# Patient Record
Sex: Male | Born: 1985 | Race: Black or African American | Hispanic: No | Marital: Single | Smoking: Never smoker
Health system: Southern US, Community
[De-identification: ages and names within clinical notes are randomized; demographics above are authoritative.]

## PROBLEM LIST (undated history)

## (undated) DIAGNOSIS — S72009A Fracture of unspecified part of neck of unspecified femur, initial encounter for closed fracture: Secondary | ICD-10-CM

## (undated) DIAGNOSIS — I10 Essential (primary) hypertension: Secondary | ICD-10-CM

## (undated) HISTORY — DX: Fracture of unspecified part of neck of unspecified femur, initial encounter for closed fracture: S72.009A

---
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
## 2004-08-22 ENCOUNTER — Emergency Department (HOSPITAL_COMMUNITY): Admission: EM | Admit: 2004-08-22 | Discharge: 2004-08-23 | Payer: Self-pay | Admitting: Emergency Medicine

## 2005-06-19 ENCOUNTER — Emergency Department (HOSPITAL_COMMUNITY): Admission: EM | Admit: 2005-06-19 | Discharge: 2005-06-20 | Payer: Self-pay | Admitting: Emergency Medicine

## 2007-12-20 ENCOUNTER — Emergency Department (HOSPITAL_COMMUNITY): Admission: EM | Admit: 2007-12-20 | Discharge: 2007-12-20 | Payer: Self-pay | Admitting: Emergency Medicine

## 2008-03-12 ENCOUNTER — Emergency Department (HOSPITAL_COMMUNITY): Admission: EM | Admit: 2008-03-12 | Discharge: 2008-03-12 | Payer: Self-pay | Admitting: Family Medicine

## 2009-09-05 ENCOUNTER — Emergency Department (HOSPITAL_COMMUNITY): Admission: EM | Admit: 2009-09-05 | Discharge: 2009-09-05 | Payer: Self-pay | Admitting: Emergency Medicine

## 2009-10-21 ENCOUNTER — Emergency Department (HOSPITAL_COMMUNITY): Admission: EM | Admit: 2009-10-21 | Discharge: 2009-10-21 | Payer: Self-pay | Admitting: Emergency Medicine

## 2011-03-03 ENCOUNTER — Inpatient Hospital Stay (INDEPENDENT_AMBULATORY_CARE_PROVIDER_SITE_OTHER)
Admission: RE | Admit: 2011-03-03 | Discharge: 2011-03-03 | Disposition: A | Discharge status: Home / Self Care | Payer: Self-pay | Source: Ambulatory Visit | Attending: Family Medicine | Admitting: Family Medicine

## 2011-03-03 DIAGNOSIS — R42 Dizziness and giddiness: Secondary | ICD-10-CM

## 2011-03-03 DIAGNOSIS — H6980 Other specified disorders of Eustachian tube, unspecified ear: Secondary | ICD-10-CM

## 2011-03-03 DIAGNOSIS — J309 Allergic rhinitis, unspecified: Secondary | ICD-10-CM

## 2011-06-25 IMAGING — CR DG CHEST 2V
2 series · 2 of 2 positions shown · non-contrast
Comparison: None.

CLINICAL DATA: Cough and shortness of breath.  Symptoms for the
past 3.5 weeks.

CHEST - 2 VIEW

[view not recorded (1 of 2)]
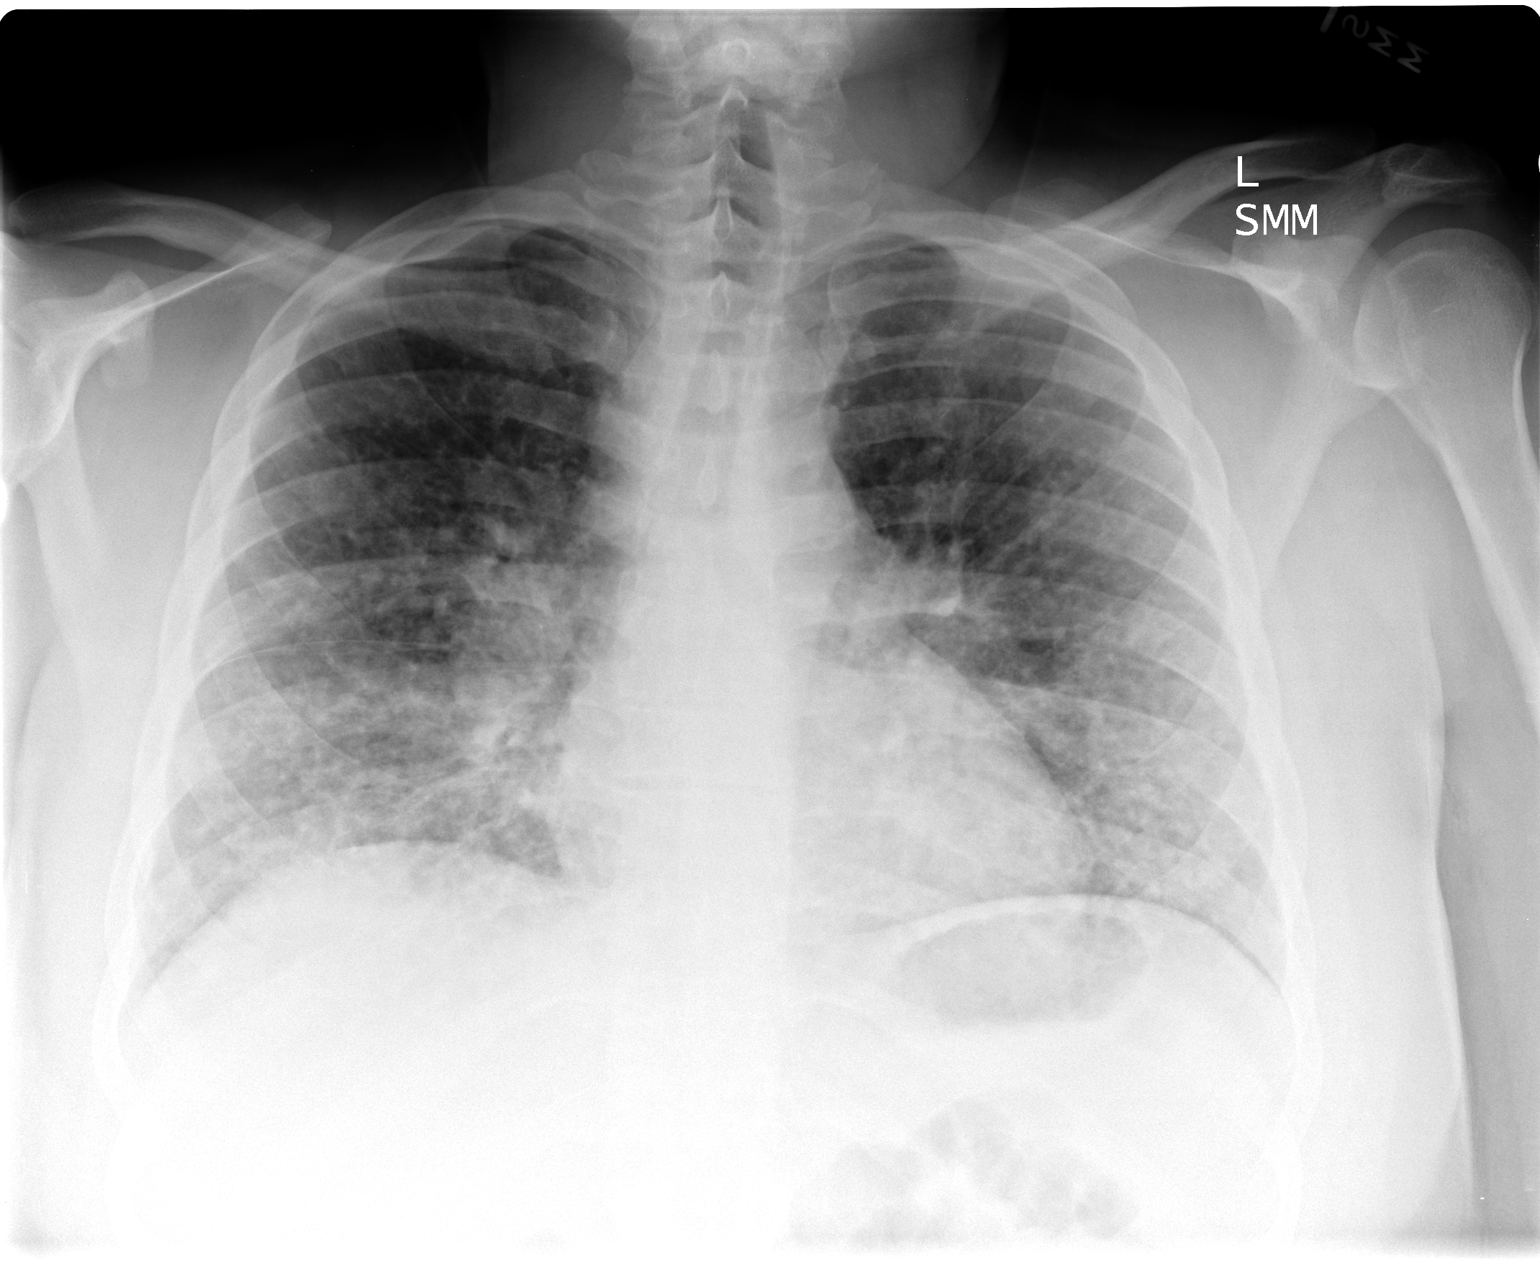

[view not recorded (2 of 2)]
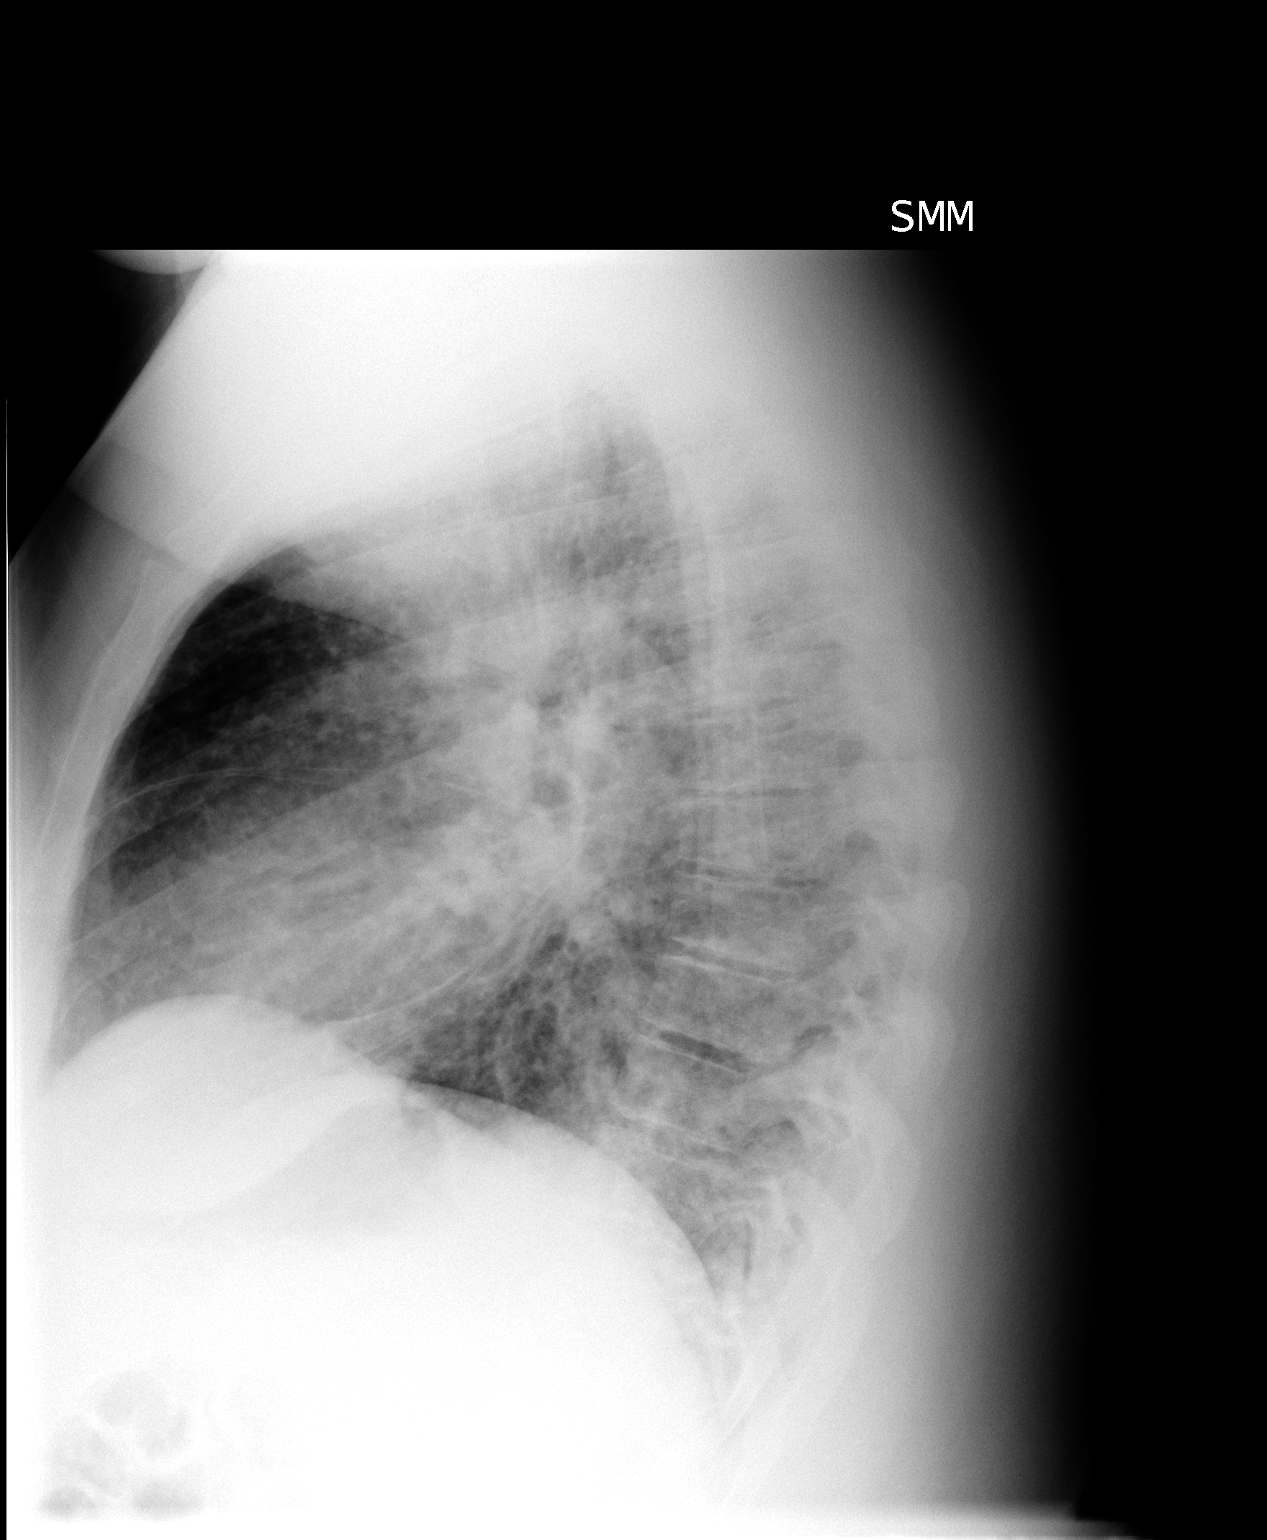

[2 of 2 positions shown; findings below may reference images not displayed]

FINDINGS: Normal sized heart.  Diffuse reticulonodular pattern
throughout both lungs.  Mild central peribronchial thickening.  No
airspace consolidation.  Unremarkable bones.
IMPRESSION: 1.  Diffuse reticulonodular pattern throughout both lungs.
Differential considerations include fungal infection, tuberculosis
infection and atypical mycobacterium infection.
2.  Mild bronchitic changes.

## 2011-06-29 ENCOUNTER — Encounter: Payer: Self-pay | Admitting: Sports Medicine

## 2011-06-29 ENCOUNTER — Ambulatory Visit (INDEPENDENT_AMBULATORY_CARE_PROVIDER_SITE_OTHER): Payer: BC Managed Care – PPO | Admitting: Sports Medicine

## 2011-06-29 VITALS — BP 215/113 | HR 101 | Temp 97.5°F | Ht 71.0 in | Wt 394.0 lb

## 2011-06-29 DIAGNOSIS — I10 Essential (primary) hypertension: Secondary | ICD-10-CM | POA: Insufficient documentation

## 2011-06-29 DIAGNOSIS — E669 Obesity, unspecified: Secondary | ICD-10-CM

## 2011-06-29 DIAGNOSIS — R05 Cough: Secondary | ICD-10-CM

## 2011-06-29 MED ORDER — OMEPRAZOLE 40 MG PO CPDR: 40.0000 mg | DELAYED_RELEASE_CAPSULE | Freq: Every day | ORAL | Status: DC

## 2011-06-29 NOTE — Assessment & Plan Note (Signed)
Reports a chronic cough of approximately 2 years. Has intermittent worsening specially whenever he has a viral-like syndrome. Reports this as "bronchitis" for which he has been given an albuterol inhaler for that does not help. In talking with him and doesn't like he potentially may have some reflux-like symptoms especially after eating greasy foods. I will trial a PPI to see if this helps with both his reflux symptoms as well as his cough as this is most likely a GERD associated cough.  He has had abnormal chest x-rays in the past that showed bronchitic changes as well as reticulonodular pattern which is had a negative PPD 4.  Consider repeat chest x-ray at next visit if cough does not improve with PPI and discontinuation PPI at that time.

## 2011-06-29 NOTE — Patient Instructions (Addendum)
It was nice meeting you today. There is lots that we talked about today but the big picture is that weight loss is the goal that we share for you.  I'm encouraged by the fact that she did so well with the biggest loser couple years ago and feel that she will have even better success with our plan. 3 things we talked about today that you have say you're be able to do is to: 1. Completed food diary for 3 random days in the next month. I like for one of these days to be over the weekend and 2 during the week. Please write down every single thing that she put in her mouth and include the quantity of how much you eat. 2. You told me you have been drinking a couple bottles of juices each day but has cut out sodas. Juices can be good sources of vitamins however they are full of sugar and loss of calories.  Thank cutting back on the beverages other than water will be a great goal for the next month. 3. You stated that as who has been your downfall. You also mentioned that substituting salads for when you go to fast food restaurants as he feels he be able to do to make steps in the right direction. This is a great plan and encourage you to do so and remember to even cut back on portion sizes of the salad dressing as this can be full of extra fat.  Do want to see you back in one month, at that time I would like to check labs on you.  I have also sent in a prescription to [facility] on [address] for omeprazole to potentially help out with your cough and reflux-like symptoms.  If anything worsens or changes please feel free call our office and schedule an earlier appointment, we do have an emergency 24-hour line for emergent questions you may have.  I'll see you in one month.

## 2011-06-29 NOTE — Assessment & Plan Note (Signed)
Patient had initially greatly elevated blood pressure on automated cuff. On recheck was 148/92.  Still within diagnostic range of hypertension however will need repeat a pressure monitoring for confirmation of diagnosis.  Discussed obesity related blood pressure changes and her current focus within his plan for blood pressure reduction is weight loss.  Please see obesity section for further plans.

## 2011-06-29 NOTE — Progress Notes (Signed)
Patient ID: Shawn Ryan, male   DOB: Dec 24, 1985, 26 y.o.   MRN: [id number] Subjective:  Shawn Ryan is a 26 y.o. male presenting today to establish care. He is not had a PCP and his lipids in urgent care for recurrent bronchitis and was noted to have high blood pressure. His parents are both patients of [facility].  His primary concern today is his weight as well as his blood pressure.  He is never been this heavy before and is weighing in at close to 400 pounds. He did participate in a biggest loser competition approximately 2 years ago was able to lose 65 pounds and was at 300 pounds at the end of the competition. He subsequently regained approximately 100 pounds over the past 2 years and attributes much of this to return fast food eating.  His other concern today is his "chronic bronchitis". He has had issues over the past 2 years with a cough that is exacerbated whenever he has cold-like symptoms. He's been trialed on a albuterol inhaler that has been ineffective in relieving his symptoms. He has no wheezing he has no respiratory distress.    ROS: Constitutional  weight loss as above, reports being tired when he gets home at night,   Infectious  reports recurrent bronchitis over the past couple years, no other infectious diseases, does work in a school with many sick contacts   Resp  Cough, nonproductive persistent however intermittently worsened over the past 2 years. Currently at baseline without exacerbation.   Does report history of snoring, no known sleep apnea,   History of reticulonodular pattern on chest x-ray, negative PPD   Cardiac  no chest pain, no palpitations   GI  mild reflux-like symptoms worsened with greasy foods, persistently  present however   GU  deferred   Skin  deferred   MSK  no reported joint issues   Trauma  questional traumatic hip fracture as 26 year old, collision during basketball game resulted in soreness of hip that was found to have for unknown type fracture    Nutrition  each fast food twice daily, as previously enjoyed salads to lose weight, previous soda drinker as well as juice drinker, does report enjoying plain water   Activity  relatively inactive on daily basis, 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 Does report that the treadmill is tolerable and a good source of exercise for him.   Neuro  deferred   Psych  has had 3 separate people and separate occasions report and they're concerned he may be bipolar due to a short temper.    ROS as per HPI and above otherwise 12 point ROS negative.   PE: GENERAL:  Adult obese African American male examined in [facility]. Alert and properly interactive, in no acute distress, no respiratory distress HNEENT: Atraumatic, normocephalic, no scleral icterus, moist mucous membranes THORAX: HEART: Regular rate and rhythm, S1-S2 heard, no murmur LUNGS: Clear to auscultation bilaterally, no crackles, no wheezes ABDOMEN:  Morbidly obese, positive bowel sounds, soft nontender, no rigidity, no guarding EXTREMITIES: Moves all 4 extremities spontaneously, no focal tenderness, no lateralization, no peripheral edema, 2+ out of 4 dorsalis pedis and posterior tibials pulses. >PSYCH: has never had any issues with racing thoughts, money or law issues, dangerous behaviors, persistently elevated or depressed mood. He does report he has been less interested and has a low energy level however does not feel depressed at this time.

## 2011-06-29 NOTE — Assessment & Plan Note (Addendum)
Patient has had great success with the biggest loser in the past going from 365 pounds to 300 pounds approximate 2 years ago ever since that time he has regained 100 pounds and has gone back to his former behaviors including eating fast food twice daily, drinking sodas (he cut this out about one month ago), drinking juices, eating inappropriate portion sizes.  He does feel like he can make the appropriate changes that he needs to and realizes that this needs to become a priority in his life. He does request some assistance and guidance with this and we will be more than happy to refer him to nutrition counseling after my next visit with him.  He has agreed to complete a 3 day food assessment was we will go over his next visit.  He also plans to decrease the amount of juices that he is drinking per day as well as make better choices at fast food restaurants and try to decrease how often he is eating at fast food restaurants. He reports that salads were big part of his prior weight loss and that he will return to eating those on a more regular basis.  Next visit we do need to do baseline laboratories for him including BMET, lipids, thyroid, A1c. Also consider doing PhD 9 next visit although patient denies current over depression or bipolar.

## 2011-07-30 ENCOUNTER — Ambulatory Visit: Payer: BC Managed Care – PPO | Admitting: Sports Medicine

## 2011-08-09 ENCOUNTER — Ambulatory Visit: Payer: BC Managed Care – PPO | Admitting: Sports Medicine

## 2011-09-15 ENCOUNTER — Encounter: Payer: Self-pay | Admitting: Sports Medicine

## 2011-09-15 ENCOUNTER — Ambulatory Visit (INDEPENDENT_AMBULATORY_CARE_PROVIDER_SITE_OTHER): Payer: BC Managed Care – PPO | Admitting: Sports Medicine

## 2011-09-15 VITALS — BP 156/94 | HR 100 | Ht 71.0 in | Wt 383.1 lb

## 2011-09-15 DIAGNOSIS — M722 Plantar fascial fibromatosis: Secondary | ICD-10-CM

## 2011-09-15 DIAGNOSIS — I10 Essential (primary) hypertension: Secondary | ICD-10-CM

## 2011-09-15 DIAGNOSIS — E669 Obesity, unspecified: Secondary | ICD-10-CM

## 2011-09-15 MED ORDER — LISINOPRIL-HYDROCHLOROTHIAZIDE 10-12.5 MG PO TABS: 1.0000 | ORAL_TABLET | Freq: Every day | ORAL | Status: DC

## 2011-09-15 NOTE — Patient Instructions (Signed)
It was great to see you today.  You have a condition called plantar fasciitis.  It is a degeneration of the connective tissue on the bottom of your feet.  I have gotten you a prescription to take to a medical supply store for cushioned sports orthotics.  If you have any issues please call our office. Please see the additional information I copied for you.  I am starting you on a BP medication that has Hydrochlorothiazide as well as Lisinopril in it.  This is a once a day medication.  I will need to get labs today and in 1 week.  You can make a nursing appointment in 1 week to follow up.  Please call Dr. Gerilyn Pilgrim to schedule a meeting regarding your diet.  She can help you with planning your meals as well as making some healthier food choices.  I will plan on seeing you in 1 month to follow up your BP, weight as well as your foot pain

## 2011-09-15 NOTE — Progress Notes (Signed)
HPI:  Shawn Ryan is a 26 y.o. male presenting today for evaluation of bilateral heel pain.  He stated that he first began in his right foot has progressed to his left foot.  There are approximately equal in severity.  He notices they are worse first thing in the morning and after a long day on his feet.  In one half months in duration with gradual worsening however is stable at this time.  He is tried new shoes without any improvement.  Does state that he has been trying to lose weight.  Has been increasing and salad and takes.  Has not really increase his activity.  Decreased soda intake.  Approximately 10 pound weight loss since last visit.  Would be interested in meeting with the nutritionist    ROS  Constitutional No fatigue  Infectious No fevers, no chills  Resp No cough, no congestion, no reported sleep apnea signs  MSK B heel pain as above  Trauma None reproted  Activity Unchanged by foot pain, no Significant regarding weight loss   Diet  still eats fast food most days, has substituted some salads.      Past Medical Hx Reviewed: yes Medications Reviewed: yes Family History Reviewed: yes   PE: GENERAL:   adult AA male.  Examined in  [facility].   sitting in exam chair comfortably   In no discomfort; norespiratory distress.   PSYCH: Alert and appropriately interactive  HNEENT: AT/[place], MMM, no scleral icterus, EOMi THORAX: HEART: RRR, S1/S2 heard, no murmur LUNGS: CTA B, no wheezes, no crackles EXTREMITIES: Moves all 4 extremities spontaneously, warm well perfused, no edema, bilateral DP and PT pulses 2/4.   >MSK/Osteopathic:  bilateral TTP over medial insertion of plantar fascia.  Flattened longitudinal arch, no bunion.  No calcaneal varus

## 2011-09-21 ENCOUNTER — Encounter: Payer: Self-pay | Admitting: Sports Medicine

## 2011-09-21 DIAGNOSIS — M722 Plantar fascial fibromatosis: Secondary | ICD-10-CM | POA: Insufficient documentation

## 2011-09-21 NOTE — Assessment & Plan Note (Signed)
Persistently elevated blood pressures.  Other weight loss will be the ultimate treatment for this, we'll start Zestoretic today.  We'll need to followup with creatinine in one week.

## 2011-09-21 NOTE — Assessment & Plan Note (Signed)
Referral for nutrition services to help with nutrition weight loss

## 2011-09-21 NOTE — Assessment & Plan Note (Addendum)
Patient provided written prescription for a longitudinal arch support sports and soles.  Patient also instructed to ice and eccentric strengthening exercises for the calf demonstrated.  If patient unable to obtain sports insoles will refer to sports medicine for further evaluation.

## 2012-10-15 ENCOUNTER — Other Ambulatory Visit: Payer: Self-pay | Admitting: Sports Medicine

## 2012-10-16 NOTE — Telephone Encounter (Signed)
90 days filled.  Needs to be seen for further refills

## 2013-07-03 ENCOUNTER — Other Ambulatory Visit: Payer: Self-pay | Admitting: Sports Medicine

## 2014-04-03 ENCOUNTER — Other Ambulatory Visit: Payer: Self-pay | Admitting: *Deleted

## 2014-04-03 MED ORDER — LISINOPRIL-HYDROCHLOROTHIAZIDE 10-12.5 MG PO TABS: ORAL_TABLET | ORAL | Status: DC

## 2014-04-03 NOTE — Telephone Encounter (Signed)
Could you please call him to schedule an appointment for a check up with labs.

## 2014-04-03 NOTE — Telephone Encounter (Signed)
Tried calling pt's number a few times.  Number is not valid.  Clovis PuMartin, Tamika L, RN

## 2014-07-27 ENCOUNTER — Other Ambulatory Visit: Payer: Self-pay | Admitting: Family Medicine

## 2014-11-26 ENCOUNTER — Other Ambulatory Visit: Payer: Self-pay | Admitting: *Deleted

## 2014-11-26 NOTE — Telephone Encounter (Signed)
Received a refill request from this patient who was last seen by Dr. Berline Choughigby in 2013. Please have him make an appointment.

## 2014-11-29 ENCOUNTER — Telehealth: Payer: Self-pay | Admitting: Family Medicine

## 2014-11-29 NOTE — Telephone Encounter (Signed)
Unable to LM, no VM Pt sent in request to est

## 2014-12-30 ENCOUNTER — Ambulatory Visit: Payer: BC Managed Care – PPO | Admitting: Family Medicine

## 2015-02-26 ENCOUNTER — Encounter: Payer: Self-pay | Admitting: Family Medicine

## 2015-02-26 ENCOUNTER — Ambulatory Visit (INDEPENDENT_AMBULATORY_CARE_PROVIDER_SITE_OTHER): Payer: 59 | Admitting: Family Medicine

## 2015-02-26 VITALS — BP 160/109 | HR 102 | Temp 98.6°F | Ht 71.0 in | Wt >= 6400 oz

## 2015-02-26 DIAGNOSIS — I10 Essential (primary) hypertension: Secondary | ICD-10-CM

## 2015-02-26 MED ORDER — LISINOPRIL-HYDROCHLOROTHIAZIDE 10-12.5 MG PO TABS: 1.0000 | ORAL_TABLET | Freq: Every day | ORAL | Status: DC

## 2015-02-26 MED ORDER — ALBUTEROL SULFATE (2.5 MG/3ML) 0.083% IN NEBU: 2.5000 mg | INHALATION_SOLUTION | Freq: Four times a day (QID) | RESPIRATORY_TRACT | Status: DC | PRN

## 2015-02-26 NOTE — Progress Notes (Signed)
   Subjective:    Shawn Ryan - 29 y.o. male MRN [id number]  Date of birth: 1986-03-05  HPI  Shawn Ryan is here for physical.   HTN Disease Monitoring: Home BP Monitoring none  Medications:lisinopril/HCTZ  Chest pain- none     Dyspnea- none Compliance-  Has been out for a few weeks now .  Lightheadedness-  no   Edema- no   Morbid obesity:  He hasn't tried any formal form of weight loss to this point. He does not follow a specific diet He used to walk but has not been as of late. He would like to lose weight and he knows that this is going to be a problem if he doesn't start trying. He is not interested in bariatric surgery at this point area He has a family history of diabetes and hypertension.   Health Maintenance:  Health Maintenance Due  Topic Date Due  . HIV Screening  12/16/1999  . INFLUENZA VACCINE  01/20/2015    -  reports that he has never smoked. He has never used smokeless tobacco. - Review of Systems: Per HPI. - Past Medical History: Patient Active Problem List   Diagnosis Date Noted  . Morbid obesity 06/29/2011  . Hypertension 06/29/2011   - Medications: reviewed and updated Current Outpatient Prescriptions  Medication Sig Dispense Refill  . albuterol (PROVENTIL) (2.5 MG/3ML) 0.083% nebulizer solution Take 3 mLs (2.5 mg total) by nebulization every 6 (six) hours as needed for wheezing or shortness of breath. 150 mL 1  . ALBUTEROL IN Inhale 2 puffs into the lungs as needed.      Marland Kitchen lisinopril-hydrochlorothiazide (PRINZIDE,ZESTORETIC) 10-12.5 MG per tablet Take 1 tablet by mouth daily. 90 tablet 0  . omeprazole (PRILOSEC) 40 MG capsule Take 1 capsule (40 mg total) by mouth daily. 30 capsule 3   No current facility-administered medications for this visit.     Review of Systems See HPI     Objective:   Physical Exam BP 160/109 mmHg  Pulse 102  Temp(Src) 98.6 F (37 C) (Oral)  Ht  (1.803 m)  Wt 451 lb (204.572 kg)  BMI 62.93  kg/m2 Gen: NAD, alert, cooperative with exam, morbidly obese, well-appearing CV: RRR, good S1/S2, no murmur,  Resp: CTABL, no wheezes, non-labored Skin: no rashes, normal turgor  Neuro: no gross deficits.  Psych: good insight, alert and oriented    Assessment & Plan:   Hypertension Uncontrolled but has been out of medications. - Refilled medicines - Advised a nurse visit for blood pressure check a week after starting the medications. - Follow-up in 4 months. - Consider lab work next time.  Morbid obesity Patient understands that he needs to try to lose some weight. He has not tried any specific formal weight loss previously. - Given instructions on exercise and diet - He'll try this for the next 4 months if is no improvement may need to consider nutrition referral - Discussed the evaluation of bariactric surgery at some point but he doesn't want this currently.

## 2015-02-26 NOTE — Patient Instructions (Addendum)
Thank you for coming in,   Follow up with me in 4 to 5 months.   We will check your weight loss at that point and see how you are doing.   Please try to walk at least 30 minutes for 5 days per week.     Please feel free to call with any questions or concerns at any time, at [phone]. --Dr. Jordan Likes  Diet Recommendations for High blood pressure  Starchy (carb) foods include: Bread, rice, pasta, potatoes, corn, crackers, bagels, muffins, all baked goods.   Protein foods include: Meat, fish, poultry, eggs, dairy foods, and beans such as pinto and kidney beans (beans also provide carbohydrate).   1. Eat at least 3 meals and 1-2 snacks per day. Never go more than 4-5 hours while awake without eating.  2. Limit starchy foods to TWO per meal and ONE per snack. ONE portion of a starchy  food is equal to the following:   - ONE slice of bread (or its equivalent, such as half of a hamburger bun).   - 1/2 cup of a "scoopable" starchy food such as potatoes or rice.   - 1 OUNCE (28 grams) of starchy snack foods such as crackers or pretzels (look on label).   - 15 grams of carbohydrate as shown on food label.  3. Both lunch and dinner should include a protein food, a carb food, and vegetables.   - Obtain twice as many veg's as protein or carbohydrate foods for both lunch and dinner.   - Try to keep frozen veg's on hand for a quick vegetable serving.     - Fresh or frozen veg's are best.  4. Breakfast should always include protein.

## 2015-02-27 ENCOUNTER — Telehealth: Payer: Self-pay | Admitting: Family Medicine

## 2015-02-27 NOTE — Assessment & Plan Note (Signed)
Patient understands that he needs to try to lose some weight. He has not tried any specific formal weight loss previously. - Given instructions on exercise and diet - He'll try this for the next 4 months if is no improvement may need to consider nutrition referral - Discussed the evaluation of bariactric surgery at some point but he doesn't want this currently.

## 2015-02-27 NOTE — Assessment & Plan Note (Signed)
Uncontrolled but has been out of medications. - Refilled medicines - Advised a nurse visit for blood pressure check a week after starting the medications. - Follow-up in 4 months. - Consider lab work next time.

## 2015-02-27 NOTE — Telephone Encounter (Signed)
The Rx given yesterday for albuterol is wrong. He only has an inhaler not a nebulizer. He needs the inhaler

## 2015-02-28 MED ORDER — ALBUTEROL SULFATE HFA 108 (90 BASE) MCG/ACT IN AERS: 2.0000 | INHALATION_SPRAY | Freq: Four times a day (QID) | RESPIRATORY_TRACT | Status: AC | PRN

## 2015-02-28 NOTE — Telephone Encounter (Signed)
Seen by Dr. Jordan Likes. Will order albuterol MDI.

## 2015-02-28 NOTE — Telephone Encounter (Signed)
Pt is calling to check the status of the medication mix up. He really needs this fixed. jw

## 2015-04-14 ENCOUNTER — Ambulatory Visit (INDEPENDENT_AMBULATORY_CARE_PROVIDER_SITE_OTHER): Payer: 59 | Admitting: Family Medicine

## 2015-04-14 ENCOUNTER — Encounter: Payer: Self-pay | Admitting: Family Medicine

## 2015-04-14 DIAGNOSIS — K219 Gastro-esophageal reflux disease without esophagitis: Secondary | ICD-10-CM | POA: Diagnosis not present

## 2015-04-14 DIAGNOSIS — I10 Essential (primary) hypertension: Secondary | ICD-10-CM

## 2015-04-14 DIAGNOSIS — R05 Cough: Secondary | ICD-10-CM | POA: Insufficient documentation

## 2015-04-14 DIAGNOSIS — R053 Chronic cough: Secondary | ICD-10-CM

## 2015-04-14 LAB — BASIC METABOLIC PANEL WITH GFR
BUN: 13 mg/dL (ref 7–25)
CO2: 30 mmol/L (ref 20–31)
Calcium: 9.7 mg/dL (ref 8.6–10.3)
Chloride: 104 mmol/L (ref 98–110)
Creat: 0.97 mg/dL (ref 0.60–1.35)
GFR, Est African American: >89 mL/min (ref >=60)
GLUCOSE: 106 mg/dL — AB (ref 65–99)
POTASSIUM: 4.7 mmol/L (ref 3.5–5.3)
Sodium: 141 mmol/L (ref 135–146)

## 2015-04-14 LAB — CBC
HCT: 42.2 % (ref 39.0–52.0)
HEMOGLOBIN: 14.3 g/dL (ref 13.0–17.0)
MCH: 29.1 pg (ref 26.0–34.0)
MCHC: 33.9 g/dL (ref 30.0–36.0)
MCV: 85.8 fL (ref 78.0–100.0)
MPV: 11.3 fL (ref 8.6–12.4)
Platelets: 392 10*3/uL (ref 150–400)
RBC: 4.92 MIL/uL (ref 4.22–5.81)
RDW: 13.1 % (ref 11.5–15.5)
WBC: 7.8 10*3/uL (ref 4.0–10.5)

## 2015-04-14 LAB — LIPID PANEL
CHOL/HDL RATIO: 4.9 ratio (ref <=5.0)
Cholesterol: 182 mg/dL (ref 125–200)
HDL: 37 mg/dL — AB (ref >=40)
LDL CALC: 131 mg/dL — AB (ref <130)
Triglycerides: 70 mg/dL (ref <150)
VLDL: 14 mg/dL (ref <30)

## 2015-04-14 LAB — POCT GLYCOSYLATED HEMOGLOBIN (HGB A1C): Hemoglobin A1C: 6.2

## 2015-04-14 MED ORDER — PANTOPRAZOLE SODIUM 40 MG PO TBEC: 40.0000 mg | DELAYED_RELEASE_TABLET | Freq: Every day | ORAL | Status: AC

## 2015-04-14 MED ORDER — LISINOPRIL-HYDROCHLOROTHIAZIDE 20-25 MG PO TABS
1.0000 | ORAL_TABLET | Freq: Every day | ORAL | Status: DC
Start: 2015-04-14 — End: 2016-10-11

## 2015-04-14 NOTE — Assessment & Plan Note (Signed)
Discussed primacy of this condition with his current and future health problems including HTN and GERD. Medical and surgical options for weight loss were discussed, though he wishes to try to lose weight himself by eating regular meals with composition heavy in vegetables and low in carbohydrates/sugars in addition to daily walking.

## 2015-04-14 NOTE — Progress Notes (Signed)
Subjective: Shawn Ryan is a 29 y.o. male patient of mine who is new to me, [facility].  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 Cough is not better or worse at any time of day/night or in relation to food. He reports a diagnosis of bronchitis being made, but can't remember by whom or on what grounds other than a chest xray. His daily coughing started before starting lisinopril. He's tried no medications for the cough, but has an albuterol inhaler which he doesn't use. He denies wheezing, dyspnea, hemoptysis, chest pain, palpitations, orthopnea, leg swelling, N/V/D. Endorses occasional heartburn symptoms.   - Non-smoker - No FH pulmonary diseases  Objective: BP 166/98 mmHg  Pulse 97  Ht 5\' 10"  (1.778 m)  Wt 445 lb (201.851 kg)  BMI 63.85 kg/m2 Gen: Morbidly obese well-appearing 29 y.o. male in no distress HEENT: Normocephalic, sclerae clear, conjunctivae normal, pupils equal and reactive, nares normal, moist mucous membranes, posterior oropharynx clear, normal dentition CV: Regular rate, no murmur; radial, DP and PT pulses 2+ bilaterally; no LE edema, no JVD, cap refill < 2 sec. Pulm: Non-labored breathing ambient air; CTAB, no wheezes or crackles  Assessment/Plan: Shawn Ryan is a 29 y.o. male here for chronic cough.  Chronic cough Suspect this is not bronchitis in young nonsmoker without history of exacerbations or controller medications. Most likely causes are ACE inhibitor-induced (though he reports symptoms predating lisinopril) or GERD-related. Will try PPI in addition to weight loss. If not improved, will D/C ACE inhibitor next. Normal exam.   Morbid obesity Discussed primacy of this condition with his current and future health problems including HTN and GERD. Medical and surgical options for weight loss were discussed, though he wishes to try to lose weight himself by eating  regular meals with composition heavy in vegetables and low in carbohydrates/sugars in addition to daily walking.

## 2015-04-14 NOTE — Assessment & Plan Note (Signed)
Suspect this is not bronchitis in young nonsmoker without history of exacerbations or controller medications. Most likely causes are ACE inhibitor-induced (though he reports symptoms predating lisinopril) or GERD-related. Will try PPI in addition to weight loss. If not improved, will D/C ACE inhibitor next. Normal exam.

## 2015-04-15 ENCOUNTER — Telehealth: Payer: Self-pay | Admitting: Family Medicine

## 2015-04-15 NOTE — Telephone Encounter (Signed)
Please call patient with lab results from yesterday's visit.

## 2015-04-15 NOTE — Telephone Encounter (Signed)
Pt calling again, also wants to know if MD faxed off paperwork to his employer that he left yesterday

## 2015-04-16 NOTE — Telephone Encounter (Signed)
Pt calling once more about this request. Would like to know how long to expect the FMLA paperwork to be completed and faxed. Additionally, he wants a printout of his recent lab results. Thank you, Dorothey BasemanSadie Reynolds, ASA

## 2015-04-16 NOTE — Telephone Encounter (Signed)
Need pick up FMLA forms that were left to be faxed.  Employer only received part of documents.  Need all of the forms left.  Will pick up when ready.  Please inform patient when he can get them

## 2015-04-16 NOTE — Telephone Encounter (Signed)
Pt calling back, says his employer received part of the paperwork but not all, says there are some pages missing, please call pt back.

## 2015-04-17 NOTE — Telephone Encounter (Signed)
I faxed the forms in their entirety a while ago and I'm not sure where they could be found now. I will need another copy of the forms to fill out. I will do so and have him pick the forms up at the front office when completed so that he can present them to his employer. Can you please communicate this to him? Both of the phone numbers I called this time did not work.

## 2015-04-21 ENCOUNTER — Telehealth: Payer: Self-pay | Admitting: Family Medicine

## 2015-04-21 NOTE — Telephone Encounter (Signed)
antropazal  (sp?) is not doing good. Having shortness of breath  Please advise

## 2015-04-22 NOTE — Telephone Encounter (Signed)
I spoke with Shawn Ryan regarding a single episode of shortness of breath without wheezing that occurred at 5:00am last week upon waking, lasting a few minutes without chest pain. He went back to sleep and awoke without symptoms. He reports 100% compliance with medications. No orthopnea or leg swelling or other wheezing. His cough is no better. I told him that if he feels he is unable to breathe he should seek medical attention right away, but he will need to schedule an appointment to discuss our next steps which will likely be stopping lisinopril though I'd like to see his BP prior to doing this. He voiced understanding.

## 2015-04-25 NOTE — Telephone Encounter (Signed)
Attempted to call again.  Still not working. [name], Shawn Ryan

## 2016-01-01 ENCOUNTER — Telehealth: Payer: Self-pay | Admitting: *Deleted

## 2016-01-01 NOTE — Telephone Encounter (Signed)
Patient needs 2 things: 1) Patient needs a letter for his employer stating that he is allow multiple breaks during his work day due to the BP medication that he is on that makes him have to use the bathroom often. 2) He would like to also extend the dates as well as how often he is allotted FMLA.  Patient states that he would have to drop off a new form to be completed for this extension.  I informed him that he may need an appt to discuss this since he has a new PCP and the parameters of FMLA will need to be changed.    Will forward to MD to advise and if patient needs an appt we will call him back to schedule this. Shawn Ryan,CMA

## 2016-01-02 NOTE — Telephone Encounter (Signed)
Patient calling again regarding FMLA, needs an update from MD regarding whether this can be completed or if he will need an appointment

## 2016-01-05 NOTE — Telephone Encounter (Signed)
Please advise him to make a schedule to see me in office. I have never seen him before. Thanks!

## 2016-01-07 NOTE — Telephone Encounter (Signed)
Pt has an appt on 01/09/2016. Sunday SpillersSharon T Saunders, CMA

## 2016-01-08 ENCOUNTER — Encounter: Payer: Self-pay | Admitting: Student

## 2016-01-08 DIAGNOSIS — R7303 Prediabetes: Secondary | ICD-10-CM | POA: Insufficient documentation

## 2016-01-09 ENCOUNTER — Ambulatory Visit (INDEPENDENT_AMBULATORY_CARE_PROVIDER_SITE_OTHER): Payer: 59 | Admitting: Student

## 2016-01-09 ENCOUNTER — Encounter: Payer: Self-pay | Admitting: Student

## 2016-01-09 VITALS — BP 167/88 | HR 98 | Temp 98.0°F | Ht 70.0 in | Wt >= 6400 oz

## 2016-01-09 DIAGNOSIS — I10 Essential (primary) hypertension: Secondary | ICD-10-CM

## 2016-01-09 NOTE — Progress Notes (Signed)
Subjective:    Patient ID: Shawn Ryan, male    DOB: [birth year], 30 y.o.   MRN: [id number]  CC:   HPI #"Bronchitis": Reports having bronchitis. He says he had this for a long time. He reports intermittent cough. He had FMLA form filled back in October 2016 for this problem.  He reports having an episode of cough more frequently than indicated on FMLA paper (every 3 months) when it was filled back in October. He is asking for FMLA extension & change in frequency of his episodes. He denies coming to the clinic or going to ED for these episodes. When asked if he had lung function test he reports having chest x-rays but not the pulmonary function tests. Upon reviewing his chart I do not see any pulmonary function tests done.   #Urinary frequency: Reports having urinary frequency with his blood pressure medication (lisinopril-hydrochlorothiazide). He wants doctor's notes stating that he is on blood pressure medication that can cause increased urinary frequency so that he can have frequent breaks at work for restroom. Asked him if we can stop hydrochlorothiazide and increase the dose of lisinopril. He says he has been on that medication for long time and he doesn't want any change to his medication. I explained to him that we are not starting a new medication other than increasing the dose on his lisinopril and taking off the hydrochlorothiazide. He got upset and said "you are wasting my time and I'm done with you". He got up to leave. I said "I am sorry you felt that way and I would be happy to have my supervising Dr. talk to you". He agreed to that.  I discussed the situation with Dr. Lum Babe. When we walked into his room, patient was standing. Dr. Lum Babe greeted the patient and introduced herself. She explained to him about the need of pulmonary function test before we give him a diagnosis of bronchitis and fill his FMLA paper. He states "I don't care about FMLA paper anymore. I just need doctors notes  stating that I am on blood pressure medication that can cause frequent urination so that I can get frequent breaks at place of work". When Dr. Lum Babe discussed about taking off hydrochlorothiazide & increasing his lisinopril, he stated he doesn't want any changes to his medication. He will rather have another PCP. Dr. Lum Babe told him he can make that request when he checkout at the front desk.   After leaving patient's room, we have agreed to give him a letter saying he is on a blood pressure medication that have tendency to cause frequent urination without any recommendation. Accordingly, I gave him a letter with the following content. "Shawn Ryan is on blood pressure medication that has tendency to increased urinary frequency. I appreciate any accommodation given to him. Alternatives to this is to change his medication that has no urinary side effects."   Review of Systems  Per history of present illness  Objective:   Physical Exam Filed Vitals:   01/09/16 1448  BP: 167/88  Pulse: 98  Temp: 98 F (36.7 C)  TempSrc: Oral  Height:  (1.778 m)  Weight: 470 lb 12.8 oz (213.553 kg)    GUY:[id number] Obese, NAD I haven't done further exam PSYCH: Upset    Assessment & Plan:  Hypertension Mildly elevated. Unfortunately patient did not give me a chance to try this today. See history of present illness for more.   "Bronchitis": Self-reported. Once FMLA paper filled for this. Patient  has no formal workup such as pulmonary function test for this. We told him that this can be done in our clinic by our pharmacist if he is interested. He states he don't care about FMLA anymore. He just wants and notes about his blood pressure medication and urinary frequency so that he can have frequent breaks at work for bathroom.

## 2016-01-09 NOTE — Assessment & Plan Note (Signed)
Mildly elevated. Unfortunately patient did not give me a chance to try this today. See history of present illness for more.

## 2016-10-11 ENCOUNTER — Other Ambulatory Visit: Payer: Self-pay | Admitting: *Deleted

## 2016-10-11 DIAGNOSIS — I10 Essential (primary) hypertension: Secondary | ICD-10-CM

## 2016-10-11 MED ORDER — LISINOPRIL-HYDROCHLOROTHIAZIDE 20-25 MG PO TABS: 1.0000 | ORAL_TABLET | Freq: Every day | ORAL | 3 refills | Status: AC

## 2020-06-20 ENCOUNTER — Ambulatory Visit
Admission: EM | Admit: 2020-06-20 | Discharge: 2020-06-20 | Disposition: A | Discharge status: Home / Self Care | Payer: 59 | Attending: Emergency Medicine | Admitting: Emergency Medicine

## 2020-06-20 DIAGNOSIS — M545 Low back pain, unspecified: Secondary | ICD-10-CM | POA: Diagnosis not present

## 2020-06-20 MED ORDER — NAPROXEN 500 MG PO TABS: 500.0000 mg | ORAL_TABLET | Freq: Two times a day (BID) | ORAL | 0 refills | Status: DC

## 2020-06-20 MED ORDER — CYCLOBENZAPRINE HCL 5 MG PO TABS: 5.0000 mg | ORAL_TABLET | Freq: Two times a day (BID) | ORAL | 0 refills | Status: AC | PRN

## 2020-06-20 NOTE — ED Provider Notes (Signed)
[name]    CSN: [id number] Arrival date & time: 06/20/20  0935      History   Chief Complaint Chief Complaint  Patient presents with  . Knee Pain  . Back Pain    HPI Shawn Ryan is a 34 y.o. male  Presenting for right knee and right low back pain.  States knee pain is nearly resolved, though having right low back pain.  Describes as burning, sharp, tight.  Nonradiating.  No known injury, inciting event.  Denies distal extremity numbness, weakness, swelling.  Tylenol has provided minimal relief.  Past Medical History:  Diagnosis Date  . Hip fracture (HCC) 1997 - 10yo   Unknown but ?SCFE    Patient Active Problem List   Diagnosis Date Noted  . Prediabetes 01/08/2016  . GERD (gastroesophageal reflux disease) 04/14/2015  . Chronic cough 04/14/2015  . Morbid obesity (HCC) 06/29/2011  . Hypertension 06/29/2011    History reviewed. No pertinent surgical history.     Home Medications    Prior to Admission medications   Medication Sig Start Date End Date Taking? Authorizing Provider  cyclobenzaprine (FLEXERIL) 5 MG tablet Take 1 tablet (5 mg total) by mouth 2 (two) times daily as needed for up to 7 days for muscle spasms. 06/20/20 06/27/20 Yes Hall-Potvin, [place], PA-C  naproxen (NAPROSYN) 500 MG tablet Take 1 tablet (500 mg total) by mouth 2 (two) times daily. 06/20/20  Yes Hall-Potvin, [place], PA-C  albuterol (PROVENTIL HFA;VENTOLIN HFA) 108 (90 BASE) MCG/ACT inhaler Inhale 2 puffs into the lungs every 6 (six) hours as needed for wheezing or shortness of breath. 02/28/15   Tyrone Nine, MD  lisinopril-hydrochlorothiazide (PRINZIDE,ZESTORETIC) 20-25 MG tablet Take 1 tablet by mouth daily. 10/11/16   Almon Hercules, MD  pantoprazole (PROTONIX) 40 MG tablet Take 1 tablet (40 mg total) by mouth daily. 04/14/15   Tyrone Nine, MD    Family History Family History  Problem Relation Age of Onset  . Diabetes Mother   . Hypertension Mother   . Diabetes  Father   . Hypertension Father     Social History Social History   Tobacco Use  . Smoking status: Never Smoker  . Smokeless tobacco: Never Used     Allergies   Patient has no known allergies.   Review of Systems Review of Systems  Constitutional: Negative for fatigue and fever.  Respiratory: Negative for cough and shortness of breath.   Cardiovascular: Negative for chest pain and palpitations.  Gastrointestinal: Negative for abdominal pain, diarrhea and vomiting.  Musculoskeletal: Positive for back pain. Negative for arthralgias and myalgias.  Skin: Negative for rash and wound.  Neurological: Negative for speech difficulty and headaches.  All other systems reviewed and are negative.    Physical Exam Triage Vital Signs ED Triage Vitals  Enc Vitals Group     BP 06/20/20 1107 (!) 175/99     Pulse Rate 06/20/20 1107 (!) 105     Resp 06/20/20 1107 20     Temp 06/20/20 1107 98.2 F (36.8 C)     Temp Source 06/20/20 1107 Oral     SpO2 06/20/20 1107 97 %     Weight --      Height --      Head Circumference --      Peak Flow --      Pain Score 06/20/20 1120 7     Pain Loc --      Pain Edu? --  Excl. in [place]? --    No data found.  Updated Vital Signs BP (!) 175/99 (BP Location: Right Arm)   Pulse (!) 105   Temp 98.2 F (36.8 C) (Oral)   Resp 20   SpO2 97%   Visual Acuity Right Eye Distance:   Left Eye Distance:   Bilateral Distance:    Right Eye Near:   Left Eye Near:    Bilateral Near:     Physical Exam Constitutional:      General: He is not in acute distress. HENT:     Head: Normocephalic and atraumatic.  Eyes:     General: No scleral icterus.    Pupils: Pupils are equal, round, and reactive to light.  Cardiovascular:     Rate and Rhythm: Normal rate.  Pulmonary:     Effort: Pulmonary effort is normal. No respiratory distress.     Breath sounds: No wheezing.  Musculoskeletal:        General: Tenderness present. No swelling.     Comments:  Right lumbar tenderness that spares spine, hip.  Exam limited due to habitus.  NVI  Skin:    Coloration: Skin is not jaundiced or pale.  Neurological:     Mental Status: He is alert and oriented to person, place, and time.      UC Treatments / Results  Labs (all labs ordered are listed, but only abnormal results are displayed) Labs Reviewed - No data to display  EKG   Radiology No results found.  Procedures Procedures (including critical care time)  Medications Ordered in UC Medications - No data to display  Initial Impression / Assessment and Plan / UC Course  I have reviewed the triage vital signs and the nursing notes.  Pertinent labs & imaging results that were available during my care of the patient were reviewed by me and considered in my medical decision making (see chart for details).     Early MSK with the right back pain compensatory from right leg pain.  Overall exam is reassuring.  Will treat supportively as below, follow-up with Ortho as needed.  Return precautions discussed, pt verbalized understanding and is agreeable to plan. Final Clinical Impressions(s) / UC Diagnoses   Final diagnoses:  Acute right-sided low back pain without sciatica     Discharge Instructions     Heat therapy (hot compress, warm wash rag, hot showers, etc.) can help relax muscles and soothe muscle aches. Cold therapy (ice packs) can be used to help swelling both after injury and after prolonged use of areas of chronic pain/aches.  Pain medication:  500 mg Naprosyn/Aleve (naproxen) every 12 hours with food:  AVOID other NSAIDs while taking this (may have Tylenol).  May take muscle relaxer as needed for severe pain / spasm.  (This medication may cause you to become tired so it is important you do not drink alcohol or operate heavy machinery while on this medication.  Recommend your first dose to be taken before bedtime to monitor for side effects safely)  Important to follow up  with specialist(s) below for further evaluation/management if your symptoms persist or worsen.    ED Prescriptions    Medication Sig Dispense Auth. Provider   naproxen (NAPROSYN) 500 MG tablet Take 1 tablet (500 mg total) by mouth 2 (two) times daily. 30 tablet Hall-Potvin, [place], PA-C   cyclobenzaprine (FLEXERIL) 5 MG tablet Take 1 tablet (5 mg total) by mouth 2 (two) times daily as needed for up to 7 days for  muscle spasms. 14 tablet Hall-Potvin, [place], PA-C     PDMP not reviewed this encounter.   Hall-Potvin, [place], [place] 06/20/20 1336

## 2020-06-20 NOTE — ED Triage Notes (Signed)
Pt present right knee and lower back pain. Pt states that the knee pain has slowed down but the sensation from that pain has started in his lower back

## 2020-06-20 NOTE — Discharge Instructions (Addendum)

## 2021-11-24 ENCOUNTER — Encounter: Payer: Self-pay | Admitting: *Deleted

## 2022-07-28 ENCOUNTER — Ambulatory Visit: Payer: 59

## 2022-07-28 ENCOUNTER — Ambulatory Visit
Admission: RE | Admit: 2022-07-28 | Discharge: 2022-07-28 | Disposition: A | Discharge status: Home / Self Care | Payer: 59 | Source: Ambulatory Visit | Attending: Nurse Practitioner | Admitting: Nurse Practitioner

## 2022-07-28 VITALS — BP 149/90 | HR 95 | Temp 98.5°F | Resp 18

## 2022-07-28 DIAGNOSIS — M545 Low back pain, unspecified: Secondary | ICD-10-CM | POA: Diagnosis not present

## 2022-07-28 MED ORDER — IBUPROFEN 800 MG PO TABS: 800.0000 mg | ORAL_TABLET | Freq: Three times a day (TID) | ORAL | 0 refills | Status: DC

## 2022-07-28 MED ORDER — IBUPROFEN 800 MG PO TABS: 800.0000 mg | ORAL_TABLET | Freq: Three times a day (TID) | ORAL | 0 refills | Status: AC
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
## 2022-07-28 NOTE — Discharge Instructions (Addendum)
Your will have to go to outpatient imaging center for your x-rays. You will have the results via [place].  You have low back pain and have been prescribed ibuprofen 800 mg three times a day
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
## 2022-09-01 ENCOUNTER — Other Ambulatory Visit: Payer: Self-pay | Admitting: Internal Medicine

## 2022-09-02 LAB — COMPLETE METABOLIC PANEL WITH GFR
AG Ratio: 1.1 (calc) (ref 1.0–2.5)
ALT: 21 U/L (ref 9–46)
AST: 14 U/L (ref 10–40)
Albumin: 4.2 g/dL (ref 3.6–5.1)
Alkaline phosphatase (APISO): 46 U/L (ref 36–130)
BUN: 16 mg/dL (ref 7–25)
CO2: 27 mmol/L (ref 20–32)
Calcium: 9.8 mg/dL (ref 8.6–10.3)
Chloride: 105 mmol/L (ref 98–110)
Creat: 1.01 mg/dL (ref 0.60–1.26)
Globulin: 3.7 g/dL (calc) (ref 1.9–3.7)
Glucose, Bld: 110 mg/dL — ABNORMAL HIGH (ref 65–99)
Potassium: 4.6 mmol/L (ref 3.5–5.3)
Sodium: 145 mmol/L (ref 135–146)
Total Bilirubin: 0.5 mg/dL (ref 0.2–1.2)
Total Protein: 7.9 g/dL (ref 6.1–8.1)
eGFR: 98 mL/min/{1.73_m2} (ref >=60)

## 2022-09-02 LAB — CBC
HCT: 46.4 % (ref 38.5–50.0)
Hemoglobin: 16 g/dL (ref 13.2–17.1)
MCH: 30 pg (ref 27.0–33.0)
MCHC: 34.5 g/dL (ref 32.0–36.0)
MCV: 86.9 fL (ref 80.0–100.0)
MPV: 11.2 fL (ref 7.5–12.5)
Platelets: 343 10*3/uL (ref 140–400)
RBC: 5.34 10*6/uL (ref 4.20–5.80)
RDW: 12.9 % (ref 11.0–15.0)
WBC: 7.4 10*3/uL (ref 3.8–10.8)

## 2022-09-02 LAB — LIPID PANEL
Cholesterol: 221 mg/dL — ABNORMAL HIGH (ref <200)
HDL: 42 mg/dL (ref >=40)
LDL Cholesterol (Calc): 156 mg/dL (calc) — ABNORMAL HIGH
Non-HDL Cholesterol (Calc): 179 mg/dL (calc) — ABNORMAL HIGH (ref <130)
Total CHOL/HDL Ratio: 5.3 (calc) — ABNORMAL HIGH (ref <5.0)
Triglycerides: 109 mg/dL (ref <150)

## 2022-09-02 LAB — VITAMIN D 25 HYDROXY (VIT D DEFICIENCY, FRACTURES): Vit D, 25-Hydroxy: 18 ng/mL — ABNORMAL LOW (ref 30–100)

## 2022-09-02 LAB — TSH: TSH: 1.58 mIU/L (ref 0.40–4.50)

## 2022-12-11 ENCOUNTER — Encounter: Payer: Self-pay | Admitting: Emergency Medicine

## 2022-12-11 ENCOUNTER — Ambulatory Visit
Admission: EM | Admit: 2022-12-11 | Discharge: 2022-12-11 | Disposition: A | Discharge status: Home / Self Care | Payer: 59 | Attending: Emergency Medicine | Admitting: Emergency Medicine

## 2022-12-11 ENCOUNTER — Other Ambulatory Visit: Payer: Self-pay

## 2022-12-11 ENCOUNTER — Ambulatory Visit (INDEPENDENT_AMBULATORY_CARE_PROVIDER_SITE_OTHER): Payer: 59

## 2022-12-11 DIAGNOSIS — M62830 Muscle spasm of back: Secondary | ICD-10-CM

## 2022-12-11 HISTORY — DX: Essential (primary) hypertension: I10

## 2022-12-11 MED ORDER — BACLOFEN 10 MG PO TABS: 10.0000 mg | ORAL_TABLET | Freq: Three times a day (TID) | ORAL | 0 refills | Status: AC

## 2022-12-11 MED ORDER — METHYLPREDNISOLONE 4 MG PO TBPK: ORAL_TABLET | ORAL | 0 refills | Status: AC

## 2022-12-11 MED ORDER — KETOROLAC TROMETHAMINE 60 MG/2ML IM SOLN
60.0000 mg | Freq: Once | INTRAMUSCULAR | Status: AC
  Administered 2022-12-11: 60 mg via INTRAMUSCULAR

## 2022-12-11 MED ORDER — DICLOFENAC SODIUM 1 % EX GEL: 4.0000 g | Freq: Four times a day (QID) | CUTANEOUS | 2 refills | Status: AC
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
## 2023-04-11 ENCOUNTER — Ambulatory Visit
Admission: EM | Admit: 2023-04-11 | Discharge: 2023-04-11 | Disposition: A | Discharge status: Home / Self Care | Payer: 59 | Attending: Physician Assistant | Admitting: Physician Assistant

## 2023-04-11 ENCOUNTER — Encounter: Payer: Self-pay | Admitting: Emergency Medicine

## 2023-04-11 DIAGNOSIS — H8110 Benign paroxysmal vertigo, unspecified ear: Secondary | ICD-10-CM

## 2023-04-11 MED ORDER — FLUTICASONE PROPIONATE 50 MCG/ACT NA SUSP: 1.0000 | Freq: Every day | NASAL | 0 refills | Status: AC

## 2023-04-11 MED ORDER — MECLIZINE HCL 25 MG PO TABS: 25.0000 mg | ORAL_TABLET | Freq: Three times a day (TID) | ORAL | 0 refills | Status: AC | PRN

## 2023-04-11 NOTE — ED Triage Notes (Addendum)
Dizzy spells starting yesterday, intermittent, no history of same. No known aggravating factors, no pain. States he feels like the room is spinning, denies feeling like he is spinning or feelings of near syncope, headache, blurred vision, denies previous blood sugar issues. Denies palpitations, SOB, CP, nausea, ear pain, swimming, water in ears, seasonal allergies. Denies taking any OTC meds to help with the dizziness.
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
## 2024-02-25 ENCOUNTER — Ambulatory Visit: Payer: Self-pay

## 2024-05-04 ENCOUNTER — Other Ambulatory Visit: Payer: Self-pay

## 2024-05-04 DIAGNOSIS — R6883 Chills (without fever): Secondary | ICD-10-CM | POA: Diagnosis not present

## 2024-05-04 DIAGNOSIS — Z79899 Other long term (current) drug therapy: Secondary | ICD-10-CM | POA: Diagnosis not present

## 2024-05-04 DIAGNOSIS — R42 Dizziness and giddiness: Secondary | ICD-10-CM | POA: Diagnosis present

## 2024-05-04 DIAGNOSIS — I10 Essential (primary) hypertension: Secondary | ICD-10-CM | POA: Insufficient documentation

## 2024-05-04 DIAGNOSIS — R000 Tachycardia, unspecified: Secondary | ICD-10-CM | POA: Diagnosis not present

## 2024-05-04 LAB — COMPREHENSIVE METABOLIC PANEL WITH GFR
ALT: 25 U/L (ref 0–44)
AST: 19 U/L (ref 15–41)
Albumin: 4.4 g/dL (ref 3.5–5.0)
Alkaline Phosphatase: 56 U/L (ref 38–126)
Anion gap: 14 (ref 5–15)
BUN: 13 mg/dL (ref 6–20)
CO2: 25 mmol/L (ref 22–32)
Calcium: 10 mg/dL (ref 8.9–10.3)
Chloride: 101 mmol/L (ref 98–111)
Creatinine, Ser: 1.13 mg/dL (ref 0.61–1.24)
GFR, Estimated: >60 mL/min (ref >60)
Glucose, Bld: 119 mg/dL — ABNORMAL HIGH (ref 70–99)
Potassium: 3.3 mmol/L — ABNORMAL LOW (ref 3.5–5.1)
Sodium: 141 mmol/L (ref 135–145)
Total Bilirubin: 0.6 mg/dL (ref 0.0–1.2)
Total Protein: 8.2 g/dL — ABNORMAL HIGH (ref 6.5–8.1)

## 2024-05-04 LAB — CBC WITH DIFFERENTIAL/PLATELET
Abs Immature Granulocytes: 0.01 K/uL (ref 0.00–0.07)
Basophils Absolute: 0 K/uL (ref 0.0–0.1)
Basophils Relative: 1 %
Eosinophils Absolute: 0 K/uL (ref 0.0–0.5)
Eosinophils Relative: 1 %
HCT: 48.2 % (ref 39.0–52.0)
Hemoglobin: 16.6 g/dL (ref 13.0–17.0)
Immature Granulocytes: 0 %
Lymphocytes Relative: 41 %
Lymphs Abs: 2.4 K/uL (ref 0.7–4.0)
MCH: 30.1 pg (ref 26.0–34.0)
MCHC: 34.4 g/dL (ref 30.0–36.0)
MCV: 87.3 fL (ref 80.0–100.0)
Monocytes Absolute: 0.4 K/uL (ref 0.1–1.0)
Monocytes Relative: 7 %
Neutro Abs: 3.1 K/uL (ref 1.7–7.7)
Neutrophils Relative %: 50 %
Platelets: 304 K/uL (ref 150–400)
RBC: 5.52 MIL/uL (ref 4.22–5.81)
RDW: 12.1 % (ref 11.5–15.5)
WBC: 6 K/uL (ref 4.0–10.5)
nRBC: 0 % (ref 0.0–0.2)

## 2024-05-04 LAB — TROPONIN T, HIGH SENSITIVITY: Troponin T High Sensitivity: <15 ng/L (ref 0–19)

## 2024-05-04 NOTE — ED Triage Notes (Signed)
 Pt POV reporting multiple episodes of dizziness today, presumed vertigo, ran out of meclizine . Also reporting chills.

## 2024-05-05 ENCOUNTER — Emergency Department (HOSPITAL_BASED_OUTPATIENT_CLINIC_OR_DEPARTMENT_OTHER): Admitting: Radiology

## 2024-05-05 ENCOUNTER — Emergency Department (HOSPITAL_BASED_OUTPATIENT_CLINIC_OR_DEPARTMENT_OTHER)
Admission: EM | Admit: 2024-05-05 | Discharge: 2024-05-05 | Disposition: A | Discharge status: Home / Self Care | Attending: Emergency Medicine | Admitting: Emergency Medicine

## 2024-05-05 DIAGNOSIS — R000 Tachycardia, unspecified: Secondary | ICD-10-CM

## 2024-05-05 DIAGNOSIS — R42 Dizziness and giddiness: Secondary | ICD-10-CM

## 2024-05-05 MED ORDER — MECLIZINE HCL 25 MG PO TABS: 25.0000 mg | ORAL_TABLET | Freq: Three times a day (TID) | ORAL | 0 refills | Status: AC | PRN

## 2024-05-05 MED ORDER — SODIUM CHLORIDE 0.9 % IV BOLUS
1000.0000 mL | Freq: Once | INTRAVENOUS | Status: AC
  Administered 2024-05-05: 1000 mL via INTRAVENOUS

## 2024-05-05 MED ORDER — MECLIZINE HCL 25 MG PO TABS
25.0000 mg | ORAL_TABLET | Freq: Once | ORAL | Status: AC
  Administered 2024-05-05: 25 mg via ORAL
  Filled 2024-05-05: qty 1

## 2024-05-05 NOTE — Discharge Instructions (Signed)
 You were seen today for dizziness.  Your workup today is reassuring.  Your meclizine  was reordered.  Of note, your heart rate is mildly elevated.  This persisted despite hydration.  Follow-up closely with your primary doctor.  Your EKG here is reassuring.

## 2024-05-05 NOTE — ED Provider Notes (Signed)
 [place] [facility]MENT AT [facility] Provider Note   CSN: [id number] Arrival date & time: 05/04/24  2242     Patient presents with: No chief complaint on file.   Shawn Ryan is a 38 y.o. male.   HPI     This is a 38 year old male who presents with dizziness.  Patient reports that he had several episodes of dizziness starting yesterday.  States that they were self-limited and resolved on their own.  He does describe these episodes as room spinning.  He has a history of vertigo but his meclizine  was expired so he did not take it.  He reports chills.  No chest pain, shortness of breath, abdominal pain, nausea, vomiting.  Denies any upper respiratory symptoms including congestion.  Prior to Admission medications   Medication Sig Start Date End Date Taking? Authorizing Provider  meclizine  (ANTIVERT ) 25 MG tablet Take 1 tablet (25 mg total) by mouth 3 (three) times daily as needed for dizziness. 05/05/24  Yes Mazal Ebey, Charmaine FALCON, MD  albuterol  (PROVENTIL  HFA;VENTOLIN  HFA) 108 (90 BASE) MCG/ACT inhaler Inhale 2 puffs into the lungs every 6 (six) hours as needed for wheezing or shortness of breath. 02/28/15   Bryn Bernardino NOVAK, MD  diclofenac  Sodium (VOLTAREN ) 1 % GEL Apply 4 g topically 4 (four) times daily. Apply to affected areas 4 times daily as needed for pain. 12/11/22   Joesph Shaver Scales, PA-C  fluticasone  (FLONASE ) 50 MCG/ACT nasal spray Place 1 spray into both nostrils daily. 04/11/23   Billy Asberry FALCON, PA-C  ibuprofen  (ADVIL ) 800 MG tablet Take 1 tablet (800 mg total) by mouth 3 (three) times daily. 07/28/22   Myrna Camelia HERO, NP  lisinopril -hydrochlorothiazide  (PRINZIDE ,ZESTORETIC ) 20-25 MG tablet Take 1 tablet by mouth daily. 10/11/16   Gonfa, Taye T, MD  meclizine  (ANTIVERT ) 25 MG tablet Take 1 tablet (25 mg total) by mouth 3 (three) times daily as needed for dizziness. 04/11/23   Billy Asberry FALCON, PA-C  methylPREDNISolone  (MEDROL  DOSEPAK) 4 MG TBPK tablet Take 24  mg on day 1, 20 mg on day 2, 16 mg on day 3, 12 mg on day 4, 8 mg on day 5, 4 mg on day 6.  Take all tablets in each row at once, do not spread tablets out throughout the day. 12/11/22   Joesph Shaver Scales, PA-C  pantoprazole  (PROTONIX ) 40 MG tablet Take 1 tablet (40 mg total) by mouth daily. 04/14/15   Bryn Bernardino NOVAK, MD    Allergies: Patient has no known allergies.    Review of Systems  Constitutional:  Positive for chills. Negative for fever.  Respiratory:  Negative for shortness of breath.   Cardiovascular:  Negative for chest pain.  Gastrointestinal:  Negative for abdominal pain, nausea and vomiting.  Neurological:  Positive for dizziness.  All other systems reviewed and are negative.   Updated Vital Signs BP (!) 171/110   Pulse 96   Temp 98.3 F (36.8 C)   Resp 18   Ht 1.778 m (5' 10)   Wt (!) 167.8 kg   SpO2 99%   BMI 53.09 kg/m   Physical Exam Vitals and nursing note reviewed.  Constitutional:      Appearance: He is well-developed. He is obese. He is not ill-appearing.  HENT:     Head: Normocephalic and atraumatic.  Eyes:     Pupils: Pupils are equal, round, and reactive to light.  Cardiovascular:     Rate and Rhythm: Regular rhythm. Tachycardia present.  Heart sounds: Normal heart sounds. No murmur heard. Pulmonary:     Effort: Pulmonary effort is normal. No respiratory distress.     Breath sounds: Normal breath sounds. No wheezing.  Abdominal:     Palpations: Abdomen is soft.     Tenderness: There is no abdominal tenderness. There is no rebound.  Musculoskeletal:     Cervical back: Neck supple.  Lymphadenopathy:     Cervical: No cervical adenopathy.  Skin:    General: Skin is warm and dry.  Neurological:     Mental Status: He is alert and oriented to person, place, and time.     Comments: Cranial nerves II through XII intact, 5 out of 5 strength in all 4 extremities, no dysmetria to finger-nose-finger  Psychiatric:        Mood and Affect: Mood  normal.     (all labs ordered are listed, but only abnormal results are displayed) Labs Reviewed  COMPREHENSIVE METABOLIC PANEL WITH GFR - Abnormal; Notable for the following components:      Result Value   Potassium 3.3 (*)    Glucose, Bld 119 (*)    Total Protein 8.2 (*)    All other components within normal limits  CBC WITH DIFFERENTIAL/PLATELET  TROPONIN T, HIGH SENSITIVITY  TROPONIN T, HIGH SENSITIVITY    EKG: EKG Interpretation Date/Time:  Friday May 04 2024 22:50:37 EST Ventricular Rate:  95 PR Interval:  172 QRS Duration:  102 QT Interval:  360 QTC Calculation: 452 R Axis:   117  Text Interpretation: Normal sinus rhythm Left posterior fascicular block T wave abnormality, consider inferior ischemia Abnormal ECG When compared with ECG of 11-Apr-2023 11:33, No significant change was found Confirmed by Bari Pfeiffer ([id number]) on 05/05/2024 12:25:19 AM  Radiology: [facility] Chest 2 View Result Date: 05/05/2024 EXAM: 2 VIEW(S) XRAY OF THE CHEST 05/05/2024 12:33:56 AM COMPARISON: Chest x-ray 10/22/2019. CLINICAL HISTORY: sob, multiple episodes of dizziness today FINDINGS: LUNGS AND PLEURA: No focal pulmonary opacity. No pleural effusion. No pneumothorax. HEART AND MEDIASTINUM: No acute abnormality of the cardiac and mediastinal silhouettes. BONES AND SOFT TISSUES: No acute osseous abnormality. IMPRESSION: 1. No acute cardiopulmonary process. Electronically signed by: Morgane Naveau MD 05/05/2024 12:37 AM EST RP Workstation: [id number]     Procedures   Medications Ordered in the ED  meclizine  (ANTIVERT ) tablet 25 mg (25 mg Oral Given 05/05/24 0120)  sodium chloride 0.9 % bolus 1,000 mL (0 mLs Intravenous Stopped 05/05/24 0217)                                    Medical Decision Making Amount and/or Complexity of Data Reviewed Labs: ordered. Radiology: ordered.   This patient presents to the ED for concern of dizziness, this involves an extensive number of treatment  options, and is a complaint that carries with it a high risk of complications and morbidity.  I considered the following differential and admission for this acute, potentially life threatening condition.  The differential diagnosis includes vertigo, orthostasis, hypertensive urgency, hypertensive emergency  MDM:    This is a 38 year old male who presents with several episodes of dizziness yesterday.  Currently he is asymptomatic.  He states that he feels normal.  EKG without any acute ischemic related changes.  Labs are reassuring.  Patient is notably mildly tachycardic 100-110.  He was given fluids.  Orthostatics are negative.  Neurologic exam is normal.  Doubt central cause.  Patient  has remained essentially asymptomatic while in the emergency department.  Will refill his meclizine  if he has any further episodes of dizziness.  Recommend follow-up with PCP.    (Labs, imaging, consults)  Labs: I Ordered, and personally interpreted labs.  The pertinent results include: CBC, CMP, troponin  Imaging Studies ordered: I ordered imaging studies including chest x-ray I independently visualized and interpreted imaging. I agree with the radiologist interpretation  Additional history obtained from chart review.  External records from outside source obtained and reviewed including prior evaluations  Cardiac Monitoring: The patient was maintained on a cardiac monitor.  If on the cardiac monitor, I personally viewed and interpreted the cardiac monitored which showed an underlying rhythm of: Sinus tachycardia  Reevaluation: After the interventions noted above, I reevaluated the patient and found that they have :resolved  Social Determinants of Health:  lives independently  Disposition: Discharge  Co morbidities that complicate the patient evaluation  Past Medical History:  Diagnosis Date   Hip fracture (HCC) 1997 - 10yo   Unknown but ?SCFE   Hypertension      Medicines Meds ordered this  encounter  Medications   meclizine  (ANTIVERT ) tablet 25 mg   sodium chloride 0.9 % bolus 1,000 mL   meclizine  (ANTIVERT ) 25 MG tablet    Sig: Take 1 tablet (25 mg total) by mouth 3 (three) times daily as needed for dizziness.    Dispense:  30 tablet    Refill:  0    I have reviewed the patients home medicines and have made adjustments as needed  Problem List / ED Course: Problem List Items Addressed This Visit   None Visit Diagnoses       Dizziness    -  Primary     Tachycardia                    Final diagnoses:  Dizziness  Tachycardia    ED Discharge Orders          Ordered    meclizine  (ANTIVERT ) 25 MG tablet  3 times daily PRN        05/05/24 0219               Bari Charmaine FALCON, MD 05/05/24 [phone]

## 2024-05-12 ENCOUNTER — Ambulatory Visit: Admission: EM | Admit: 2024-05-12 | Discharge: 2024-05-12 | Disposition: A | Discharge status: Home / Self Care

## 2024-05-12 ENCOUNTER — Encounter: Payer: Self-pay | Admitting: Emergency Medicine

## 2024-05-12 ENCOUNTER — Other Ambulatory Visit: Payer: Self-pay

## 2024-05-12 DIAGNOSIS — L03012 Cellulitis of left finger: Secondary | ICD-10-CM

## 2024-05-12 MED ORDER — MUPIROCIN 2 % EX OINT: 1.0000 | TOPICAL_OINTMENT | Freq: Two times a day (BID) | CUTANEOUS | 1 refills | Status: AC

## 2024-05-12 MED ORDER — DOXYCYCLINE HYCLATE 100 MG PO CAPS: 100.0000 mg | ORAL_CAPSULE | Freq: Two times a day (BID) | ORAL | 0 refills | Status: AC

## 2024-05-12 NOTE — Discharge Instructions (Addendum)
 Incision and drainage done of the left middle finger paronychia.  A fair amount of purulent drainage came out of the finger.  We will start antibiotics by mouth as well as a topical antibiotic.  Recommend leaving the current dressing in place until later this evening then may remove and wash the area with soap and water.  Apply a small amount of mupirocin  to the area and cover it.  Recommend keeping the area covered for the first 2 days and changing the dressing at least twice daily.  After this may keep the area open to area if there is no drainage and continue to apply the mupirocin  2-3 times daily.  We have sent in the following prescriptions: Doxycycline  100 mg twice daily for 7 days. Take this with food.   Mupirocin  ointment twice daily to the affected area until healed. Return to urgent care or PCP if symptoms worsen or fail to resolve.

## 2024-05-12 NOTE — ED Triage Notes (Addendum)
 Pt here for redness and swelling to area around left middle finger nail x 6 days since having a manicure

## 2024-05-12 NOTE — ED Provider Notes (Signed)
 [name]    CSN: [id number] Arrival date & time: 05/12/24  1518      History   Chief Complaint Chief Complaint  Patient presents with   Hand Pain    HPI Shawn Ryan is a 38 y.o. male.   38 year old male who presents urgent care with complaints of infection in his left middle finger.  He reports he had a manicure done and after this he began developing pain and swelling as well as redness around the nail of his middle finger.  He did contact the manicurist who advised him to seek medical care and that they would help him with this.  He denies any fevers or chills.  He has no history of this in the past.   Hand Pain    Past Medical History:  Diagnosis Date   Hip fracture (HCC) 1997 - 10yo   Unknown but ?SCFE   Hypertension     Patient Active Problem List   Diagnosis Date Noted   Prediabetes 01/08/2016   GERD (gastroesophageal reflux disease) 04/14/2015   Chronic cough 04/14/2015   Morbid obesity (HCC) 06/29/2011   Hypertension 06/29/2011    History reviewed. No pertinent surgical history.     Home Medications    Prior to Admission medications   Medication Sig Start Date End Date Taking? Authorizing Provider  anastrozole (ARIMIDEX) 1 MG tablet Take 1 mg by mouth daily. 05/02/24 05/02/25 Yes [provider]  cetirizine (ZYRTEC) 10 MG tablet Take 10 mg by mouth daily. 02/14/21  Yes [provider]  doxycycline  (VIBRAMYCIN ) 100 MG capsule Take 1 capsule (100 mg total) by mouth 2 (two) times daily for 7 days. 05/12/24 05/19/24 Yes Kree Armato A, PA-C  lisinopril  (ZESTRIL ) 10 MG tablet Take 10 mg by mouth daily. 08/10/12  Yes [provider]  MOUNJARO 15 MG/0.5ML Pen Inject 15 mg into the skin once a week. 10/21/23  Yes [provider]  mupirocin  ointment (BACTROBAN ) 2 % Apply 1 Application topically 2 (two) times daily. 05/12/24  Yes Brita Jurgensen A, PA-C  PERMETHRIN EX Take 25 mg by mouth daily at 2 PM.  05/06/24  Yes [provider]  scopolamine (TRANSDERM-SCOP) 1 MG/3DAYS Place 1 patch onto the skin every 3 (three) days. 02/27/24  Yes [provider]  albuterol  (PROVENTIL  HFA;VENTOLIN  HFA) 108 (90 BASE) MCG/ACT inhaler Inhale 2 puffs into the lungs every 6 (six) hours as needed for wheezing or shortness of breath. 02/28/15   Bryn Bernardino NOVAK, MD  diclofenac  Sodium (VOLTAREN ) 1 % GEL Apply 4 g topically 4 (four) times daily. Apply to affected areas 4 times daily as needed for pain. 12/11/22   Joesph Shaver Scales, PA-C  fluticasone  (FLONASE ) 50 MCG/ACT nasal spray Place 1 spray into both nostrils daily. 04/11/23   Billy Asberry FALCON, PA-C  ibuprofen  (ADVIL ) 800 MG tablet Take 1 tablet (800 mg total) by mouth 3 (three) times daily. 07/28/22   Myrna Camelia HERO, NP  lisinopril -hydrochlorothiazide  (PRINZIDE ,ZESTORETIC ) 20-25 MG tablet Take 1 tablet by mouth daily. 10/11/16   Gonfa, Taye T, MD  meclizine  (ANTIVERT ) 25 MG tablet Take 1 tablet (25 mg total) by mouth 3 (three) times daily as needed for dizziness. 04/11/23   Billy Asberry FALCON, PA-C  meclizine  (ANTIVERT ) 25 MG tablet Take 1 tablet (25 mg total) by mouth 3 (three) times daily as needed for dizziness. 05/05/24   Horton, Charmaine FALCON, MD  methylPREDNISolone  (MEDROL  DOSEPAK) 4 MG TBPK tablet Take 24 mg on day 1, 20  mg on day 2, 16 mg on day 3, 12 mg on day 4, 8 mg on day 5, 4 mg on day 6.  Take all tablets in each row at once, do not spread tablets out throughout the day. Patient not taking: Reported on 05/12/2024 12/11/22   Joesph Shaver Scales, PA-C  pantoprazole  (PROTONIX ) 40 MG tablet Take 1 tablet (40 mg total) by mouth daily. 04/14/15   Bryn Bernardino NOVAK, MD    Family History Family History  Problem Relation Age of Onset   Diabetes Mother    Hypertension Mother    Diabetes Father    Hypertension Father     Social History Social History   Tobacco Use   Smoking status: Never   Smokeless tobacco: Never     Allergies   Patient  has no known allergies.   Review of Systems Review of Systems   Physical Exam Triage Vital Signs ED Triage Vitals  Encounter Vitals Group     BP 05/12/24 1541 (!) 162/88     Girls Systolic BP Percentile --      Girls Diastolic BP Percentile --      Boys Systolic BP Percentile --      Boys Diastolic BP Percentile --      Pulse Rate 05/12/24 1541 95     Resp 05/12/24 1541 18     Temp 05/12/24 1541 98.6 F (37 C)     Temp Source 05/12/24 1541 Oral     SpO2 05/12/24 1541 98 %     Weight --      Height --      Head Circumference --      Peak Flow --      Pain Score 05/12/24 1542 3     Pain Loc --      Pain Education --      Exclude from Growth Chart --    No data found.  Updated Vital Signs BP (!) 162/88 (BP Location: Right Arm)   Pulse 95   Temp 98.6 F (37 C) (Oral)   Resp 18   SpO2 98%   Visual Acuity Right Eye Distance:   Left Eye Distance:   Bilateral Distance:    Right Eye Near:   Left Eye Near:    Bilateral Near:     Physical Exam Vitals and nursing note reviewed.  Constitutional:      General: He is not in acute distress.    Appearance: He is well-developed.  HENT:     Head: Normocephalic and atraumatic.  Eyes:     Conjunctiva/sclera: Conjunctivae normal.  Cardiovascular:     Rate and Rhythm: Normal rate and regular rhythm.     Heart sounds: No murmur heard. Pulmonary:     Effort: Pulmonary effort is normal. No respiratory distress.  Musculoskeletal:        General: No swelling.       Hands:     Cervical back: Neck supple.  Skin:    General: Skin is warm and dry.     Capillary Refill: Capillary refill takes less than 2 seconds.  Neurological:     Mental Status: He is alert.  Psychiatric:        Mood and Affect: Mood normal.   Incision and drainage done of the left middle finger  paronychia.  Risk benefits and alternatives were discussed with the patient.  After informed consent and risk benefits and alternatives discussion the area was  cleansed with Betadine.  Pain-eze spray was  used to achieve anesthesia in the area and an 11 blade was used to make a single stab incision.  Moderate amount of purulent drainage was expressed from the area.  The area was cleansed and hemostasis was achieved with pressure.  The patient tolerated procedure well without complications.   UC Treatments / Results  Labs (all labs ordered are listed, but only abnormal results are displayed) Labs Reviewed - No data to display  EKG   Radiology No results found.  Procedures Procedures (including critical care time)  Medications Ordered in UC Medications - No data to display  Initial Impression / Assessment and Plan / UC Course  I have reviewed the triage vital signs and the nursing notes.  Pertinent labs & imaging results that were available during my care of the patient were reviewed by me and considered in my medical decision making (see chart for details).     Paronychia of finger, left   Incision and drainage done of the left middle finger paronychia.  A fair amount of purulent drainage came out of the finger.  We will start antibiotics by mouth as well as a topical antibiotic.  Recommend leaving the current dressing in place until later this evening then may remove and wash the area with soap and water.  Apply a small amount of mupirocin  to the area and cover it.  Recommend keeping the area covered for the first 2 days and changing the dressing at least twice daily.  After this may keep the area open to area if there is no drainage and continue to apply the mupirocin  2-3 times daily.  We have sent in the following prescriptions: Doxycycline  100 mg twice daily for 7 days. Take this with food.   Mupirocin  ointment twice daily to the affected area until healed. Return to urgent care or PCP if symptoms worsen or fail to resolve.  Final Clinical Impressions(s) / UC Diagnoses   Final diagnoses:  Paronychia of finger, left     Discharge  Instructions      Incision and drainage done of the left middle finger paronychia.  A fair amount of purulent drainage came out of the finger.  We will start antibiotics by mouth as well as a topical antibiotic.  Recommend leaving the current dressing in place until later this evening then may remove and wash the area with soap and water.  Apply a small amount of mupirocin  to the area and cover it.  Recommend keeping the area covered for the first 2 days and changing the dressing at least twice daily.  After this may keep the area open to area if there is no drainage and continue to apply the mupirocin  2-3 times daily.  We have sent in the following prescriptions: Doxycycline  100 mg twice daily for 7 days. Take this with food.   Mupirocin  ointment twice daily to the affected area until healed. Return to urgent care or PCP if symptoms worsen or fail to resolve.      ED Prescriptions     Medication Sig Dispense Auth. Provider   doxycycline  (VIBRAMYCIN ) 100 MG capsule Take 1 capsule (100 mg total) by mouth 2 (two) times daily for 7 days. 14 capsule Elissa Grieshop A, PA-C   mupirocin  ointment (BACTROBAN ) 2 % Apply 1 Application topically 2 (two) times daily. 22 g Teresa Almarie LABOR, [place]      PDMP not reviewed this encounter.   Teresa Almarie LABOR, PA-C 05/12/24 1621

## 2024-05-26 ENCOUNTER — Encounter: Payer: Self-pay | Admitting: Emergency Medicine

## 2024-05-26 ENCOUNTER — Ambulatory Visit: Admission: EM | Admit: 2024-05-26 | Discharge: 2024-05-26 | Disposition: A | Discharge status: Home / Self Care

## 2024-05-26 DIAGNOSIS — L03012 Cellulitis of left finger: Secondary | ICD-10-CM | POA: Diagnosis not present

## 2024-05-26 NOTE — ED Notes (Signed)
 Bacitracin applied to wound, wound care completed by K. Fields [facility] FUNDRAISER

## 2024-05-26 NOTE — Discharge Instructions (Addendum)
 Manually expressed purulence from the finger.  Recommend applying mupirocin  twice daily and covering the area with a dry dressing for at least 3 days.  Can soak the area in either Epsom salt water or warm soapy water 1-2 times daily.  Do not believe that antibiotics by mouth are needed at this time.  Return to urgent care if needed.

## 2024-05-26 NOTE — ED Provider Notes (Signed)
 [name]    CSN: [id number] Arrival date & time: 05/26/24  0858      History   Chief Complaint Chief Complaint  Patient presents with   Hand Pain    HPI Shawn Ryan is a 38 y.o. male.   38 year old male who returns to urgent care secondary to increased swelling around his left index finger.  He was here about a week ago and an incision and drainage was done.  He reports that the area is not really painful but he noticed swelling in it.  He denies any fevers or chills.  He finished his antibiotics as prescribed.  He does still have mupirocin  at home.   Hand Pain Pertinent negatives include no chest pain, no abdominal pain and no shortness of breath.    Past Medical History:  Diagnosis Date   Hip fracture (HCC) 1997 - 10yo   Unknown but ?SCFE   Hypertension     Patient Active Problem List   Diagnosis Date Noted   Prediabetes 01/08/2016   GERD (gastroesophageal reflux disease) 04/14/2015   Chronic cough 04/14/2015   Morbid obesity (HCC) 06/29/2011   Hypertension 06/29/2011    History reviewed. No pertinent surgical history.     Home Medications    Prior to Admission medications   Medication Sig Start Date End Date Taking? Authorizing Provider  losartan (COZAAR) 25 MG tablet Take 25 mg by mouth daily. 05/10/24  Yes [provider]  albuterol  (PROVENTIL  HFA;VENTOLIN  HFA) 108 (90 BASE) MCG/ACT inhaler Inhale 2 puffs into the lungs every 6 (six) hours as needed for wheezing or shortness of breath. Patient not taking: Reported on 05/26/2024 02/28/15   Bryn Bernardino NOVAK, MD  anastrozole (ARIMIDEX) 1 MG tablet Take 1 mg by mouth daily. 05/02/24 05/02/25  [provider]  cetirizine (ZYRTEC) 10 MG tablet Take 10 mg by mouth daily. Patient not taking: Reported on 05/26/2024 02/14/21   [provider]  diclofenac  (VOLTAREN ) 75 MG EC tablet Take 75 mg by mouth 2 (two) times daily. Patient not taking: Reported on 05/26/2024 05/05/24    [provider]  diclofenac  Sodium (VOLTAREN ) 1 % GEL Apply 4 g topically 4 (four) times daily. Apply to affected areas 4 times daily as needed for pain. Patient not taking: Reported on 05/26/2024 12/11/22   Joesph Shaver Scales, PA-C  fluticasone  (FLONASE ) 50 MCG/ACT nasal spray Place 1 spray into both nostrils daily. 04/11/23   Billy Asberry FALCON, PA-C  ibuprofen  (ADVIL ) 800 MG tablet Take 1 tablet (800 mg total) by mouth 3 (three) times daily. 07/28/22   Myrna Camelia HERO, NP  lisinopril  (ZESTRIL ) 10 MG tablet Take 10 mg by mouth daily. Patient not taking: Reported on 05/26/2024 08/10/12   [provider]  lisinopril -hydrochlorothiazide  (PRINZIDE ,ZESTORETIC ) 20-25 MG tablet Take 1 tablet by mouth daily. Patient not taking: Reported on 05/26/2024 10/11/16   Gonfa, Taye T, MD  meclizine  (ANTIVERT ) 25 MG tablet Take 1 tablet (25 mg total) by mouth 3 (three) times daily as needed for dizziness. 04/11/23   Billy Asberry FALCON, PA-C  meclizine  (ANTIVERT ) 25 MG tablet Take 1 tablet (25 mg total) by mouth 3 (three) times daily as needed for dizziness. 05/05/24   Horton, Charmaine FALCON, MD  methylPREDNISolone  (MEDROL  DOSEPAK) 4 MG TBPK tablet Take 24 mg on day 1, 20 mg on day 2, 16 mg on day 3, 12 mg on day 4, 8 mg on day 5, 4 mg on day 6.  Take all tablets in each  row at once, do not spread tablets out throughout the day. Patient not taking: Reported on 05/12/2024 12/11/22   Joesph Shaver Scales, PA-C  MOUNJARO 15 MG/0.5ML Pen Inject 15 mg into the skin once a week. 10/21/23   [provider]  mupirocin  ointment (BACTROBAN ) 2 % Apply 1 Application topically 2 (two) times daily. 05/12/24   Avner Stroder A, PA-C  pantoprazole  (PROTONIX ) 40 MG tablet Take 1 tablet (40 mg total) by mouth daily. Patient not taking: Reported on 05/26/2024 04/14/15   Bryn Bernardino NOVAK, MD  PERMETHRIN EX Take 25 mg by mouth daily at 2 PM. Patient not taking: Reported on 05/26/2024 05/06/24   [provider]   scopolamine (TRANSDERM-SCOP) 1 MG/3DAYS Place 1 patch onto the skin every 3 (three) days. Patient not taking: Reported on 05/26/2024 02/27/24   [provider]    Family History Family History  Problem Relation Age of Onset   Diabetes Mother    Hypertension Mother    Diabetes Father    Hypertension Father     Social History Social History   Tobacco Use   Smoking status: Never   Smokeless tobacco: Never  Vaping Use   Vaping status: Never Used  Substance Use Topics   Alcohol use: Never   Drug use: Never     Allergies   Patient has no known allergies.   Review of Systems Review of Systems  Constitutional:  Negative for chills and fever.  HENT:  Negative for ear pain and sore throat.   Eyes:  Negative for pain and visual disturbance.  Respiratory:  Negative for cough and shortness of breath.   Cardiovascular:  Negative for chest pain and palpitations.  Gastrointestinal:  Negative for abdominal pain and vomiting.  Genitourinary:  Negative for dysuria and hematuria.  Musculoskeletal:  Negative for arthralgias and back pain.  Skin:  Negative for color change and rash.  Neurological:  Negative for seizures and syncope.  All other systems reviewed and are negative.    Physical Exam Triage Vital Signs ED Triage Vitals  Encounter Vitals Group     BP 05/26/24 1002 (!) 155/106     Girls Systolic BP Percentile --      Girls Diastolic BP Percentile --      Boys Systolic BP Percentile --      Boys Diastolic BP Percentile --      Pulse Rate 05/26/24 1002 92     Resp 05/26/24 1002 18     Temp 05/26/24 1002 98 F (36.7 C)     Temp Source 05/26/24 1002 Oral     SpO2 05/26/24 1002 98 %     Weight 05/26/24 1006 (!) 363 lb (164.7 kg)     Height 05/26/24 1006 5' 10 (1.778 m)     Head Circumference --      Peak Flow --      Pain Score 05/26/24 1006 0     Pain Loc --      Pain Education --      Exclude from Growth Chart --    No data found.  Updated Vital  Signs BP (!) 155/106 (BP Location: Right Arm)   Pulse 92   Temp 98 F (36.7 C) (Oral)   Resp 18   Ht 5' 10 (1.778 m)   Wt (!) 363 lb (164.7 kg)   SpO2 98%   BMI 52.09 kg/m   Visual Acuity Right Eye Distance:   Left Eye Distance:   Bilateral Distance:  Right Eye Near:   Left Eye Near:    Bilateral Near:     Physical Exam Vitals and nursing note reviewed.  Constitutional:      General: He is not in acute distress.    Appearance: He is well-developed.  HENT:     Head: Normocephalic and atraumatic.  Eyes:     Conjunctiva/sclera: Conjunctivae normal.  Cardiovascular:     Rate and Rhythm: Normal rate.  Pulmonary:     Effort: Pulmonary effort is normal. No respiratory distress.  Musculoskeletal:        General: No swelling.       Hands:     Cervical back: Neck supple.  Skin:    General: Skin is warm and dry.     Capillary Refill: Capillary refill takes less than 2 seconds.  Neurological:     Mental Status: He is alert.  Psychiatric:        Mood and Affect: Mood normal.      UC Treatments / Results  Labs (all labs ordered are listed, but only abnormal results are displayed) Labs Reviewed - No data to display  EKG   Radiology No results found.  Procedures Procedures (including critical care time)  Medications Ordered in UC Medications - No data to display  Initial Impression / Assessment and Plan / UC Course  I have reviewed the triage vital signs and the nursing notes.  Pertinent labs & imaging results that were available during my care of the patient were reviewed by me and considered in my medical decision making (see chart for details).     Paronychia of finger, left   Manually expressed purulence from the left index finger.  Recommend applying mupirocin  twice daily and covering the area with a dry dressing for at least 3 days.  Can soak the area in either Epsom salt water or warm soapy water 1-2 times daily.  Do not believe that antibiotics  by mouth are needed at this time.  Return to urgent care if needed.  Final Clinical Impressions(s) / UC Diagnoses   Final diagnoses:  Paronychia of finger, left     Discharge Instructions      Manually expressed purulence from the finger.  Recommend applying mupirocin  twice daily and covering the area with a dry dressing for at least 3 days.  Can soak the area in either Epsom salt water or warm soapy water 1-2 times daily.  Do not believe that antibiotics by mouth are needed at this time.  Return to urgent care if needed.    ED Prescriptions   None    PDMP not reviewed this encounter.   Teresa Almarie LABOR, PA-C 05/26/24 1028

## 2024-05-26 NOTE — ED Triage Notes (Signed)
 Pt was seen here for infection around left index finger nail on 11/22  Area was drained at that time and pt was put on antibiotics.  Pt returns today due to finger becoming red and swollen again  Pt st's he finished the antibiotics
